# Patient Record
Sex: Female | Born: 1985
Health system: Southern US, Community
[De-identification: ages and names within clinical notes are randomized; demographics above are authoritative.]

## PROBLEM LIST (undated history)

## (undated) DIAGNOSIS — K589 Irritable bowel syndrome without diarrhea: Secondary | ICD-10-CM

## (undated) HISTORY — PX: NO PAST SURGERIES: SHX2092

## (undated) HISTORY — DX: Irritable bowel syndrome, unspecified: K58.9

---

## 2009-03-20 HISTORY — PX: AUGMENTATION MAMMAPLASTY: SUR837

## 2020-11-20 ENCOUNTER — Other Ambulatory Visit: Payer: Self-pay

## 2020-11-20 ENCOUNTER — Ambulatory Visit (HOSPITAL_COMMUNITY)
Admission: EM | Admit: 2020-11-20 | Discharge: 2020-11-20 | Disposition: A | Discharge status: Home / Self Care | Payer: 59

## 2020-11-20 ENCOUNTER — Encounter (HOSPITAL_COMMUNITY): Payer: Self-pay | Admitting: *Deleted

## 2020-11-20 ENCOUNTER — Ambulatory Visit (HOSPITAL_COMMUNITY): Admission: EM | Admit: 2020-11-20 | Payer: Self-pay

## 2020-11-20 DIAGNOSIS — R112 Nausea with vomiting, unspecified: Secondary | ICD-10-CM

## 2020-11-20 DIAGNOSIS — U071 COVID-19: Secondary | ICD-10-CM

## 2020-11-20 MED ORDER — FLUTICASONE PROPIONATE 50 MCG/ACT NA SUSP: 2.0000 | Freq: Every day | NASAL | 0 refills | Status: DC

## 2020-11-20 MED ORDER — ONDANSETRON HCL 4 MG PO TABS: 4.0000 mg | ORAL_TABLET | Freq: Four times a day (QID) | ORAL | 0 refills | Status: DC

## 2020-11-20 MED ORDER — BENZONATATE 100 MG PO CAPS: 100.0000 mg | ORAL_CAPSULE | Freq: Three times a day (TID) | ORAL | 0 refills | Status: DC

## 2020-11-20 NOTE — ED Triage Notes (Signed)
C/O sore throat, runny nose, HA, generalized "stomach ache", and vomiting onset yesterday.  Today had + home Covid test.  States able to keep down some PO fluids.

## 2020-11-20 NOTE — ED Provider Notes (Signed)
[facility]    CSN: [id number] Arrival date & time: 11/20/20  1506      History   Chief Complaint Chief Complaint  Patient presents with   Sore Throat   Headache    HPI Rhonda Aguirre is a 35 y.o. female.   HPI  COVID: Patient reports that yesterday she had a home positive COVID test.  She has symptoms such as sore throat, mild headache, generalized GI upset, runny nose. She has tried theraflu for symptoms with little relief. At the moment her nausea and vomiting are her worst symptom but this is better than it was yesterday. No hematemesis. No fevers, SOB, chest pain.   History reviewed. No pertinent past medical history.  There are no problems to display for this patient.   History reviewed. No pertinent surgical history.  OB History   No obstetric history on file.      Home Medications    Prior to Admission medications   Medication Sig Start Date End Date Taking? Authorizing Provider  UNKNOWN TO PATIENT MVI for skin   Yes [provider]    Family History Family History  Problem Relation Age of Onset   Rheum arthritis Mother     Social History Social History   Tobacco Use   Smoking status: Never   Smokeless tobacco: Never  Vaping Use   Vaping Use: Never used  Substance Use Topics   Alcohol use: Yes    Comment: 7 drinks/wk   Drug use: Never     Allergies   Patient has no known allergies.   Review of Systems Review of Systems  As stated above in HPI Physical Exam Triage Vital Signs ED Triage Vitals  Enc Vitals Group     BP 11/20/20 1540 111/68     Pulse Rate 11/20/20 1540 (!) 58     Resp 11/20/20 1540 16     Temp 11/20/20 1540 98.3 F (36.8 C)     Temp Source 11/20/20 1540 Oral     SpO2 11/20/20 1540 100 %     Weight --      Height --      Head Circumference --      Peak Flow --      Pain Score 11/20/20 1541 4     Pain Loc --      Pain Edu? --      Excl. in GC? --    No data found.  Updated Vital  Signs BP 111/68   Pulse (!) 58   Temp 98.3 F (36.8 C) (Oral)   Resp 16   LMP 11/17/2020 (Exact Date)   SpO2 100%   Physical Exam Vitals and nursing note reviewed.  Constitutional:      General: She is not in acute distress.    Appearance: She is well-developed. She is not ill-appearing, toxic-appearing or diaphoretic.  HENT:     Head: Normocephalic and atraumatic.     Right Ear: No tenderness. No middle ear effusion. Tympanic membrane is not erythematous.     Left Ear: Tympanic membrane normal. No tenderness.  No middle ear effusion. Tympanic membrane is not erythematous.     Nose: Congestion and rhinorrhea present.     Mouth/Throat:     Pharynx: Oropharynx is clear. Uvula midline. No oropharyngeal exudate, posterior oropharyngeal erythema or uvula swelling.     Tonsils: No tonsillar exudate or tonsillar abscesses.  Eyes:     Extraocular Movements:     Right eye: Normal extraocular  motion.     Left eye: Normal extraocular motion.     Conjunctiva/sclera: Conjunctivae normal.     Pupils: Pupils are equal, round, and reactive to light.  Cardiovascular:     Rate and Rhythm: Normal rate and regular rhythm.     Heart sounds: Normal heart sounds.  Pulmonary:     Effort: Pulmonary effort is normal.     Breath sounds: Normal breath sounds.  Musculoskeletal:     Cervical back: Normal range of motion and neck supple.  Lymphadenopathy:     Cervical: No cervical adenopathy.  Skin:    General: Skin is warm.  Neurological:     Mental Status: She is alert and oriented to person, place, and time.     UC Treatments / Results  Labs (all labs ordered are listed, but only abnormal results are displayed) Labs Reviewed - No data to display  EKG   Radiology No results found.  Procedures Procedures (including critical care time)  Medications Ordered in UC Medications - No data to display  Initial Impression / Assessment and Plan / UC Course  I have reviewed the triage vital  signs and the nursing notes.  Pertinent labs & imaging results that were available during my care of the patient were reviewed by me and considered in my medical decision making (see chart for details).     New.  Discussed COVID-19 including the importance of rest and hydration along with O2 monitoring.  We discussed red flag signs symptoms.  Discussed typical course of illness with the patient.  Sending in Zofran, Flonase and Tessalon for her to use as needed.  Final Clinical Impressions(s) / UC Diagnoses   Final diagnoses:  None   Discharge Instructions   None    ED Prescriptions   None    PDMP not reviewed this encounter.   Rushie Chestnut, [place] 11/20/20 1641

## 2021-04-29 ENCOUNTER — Other Ambulatory Visit: Payer: Self-pay

## 2021-04-29 ENCOUNTER — Ambulatory Visit
Admission: RE | Admit: 2021-04-29 | Discharge: 2021-04-29 | Disposition: A | Discharge status: Home / Self Care | Payer: 59 | Source: Ambulatory Visit | Attending: Family Medicine | Admitting: Family Medicine

## 2021-04-29 ENCOUNTER — Other Ambulatory Visit: Payer: Self-pay | Admitting: Family Medicine

## 2021-04-29 DIAGNOSIS — R1031 Right lower quadrant pain: Secondary | ICD-10-CM

## 2021-04-29 NOTE — Progress Notes (Signed)
4 wls of RLQ abd pain. Comes and goes w/ worsening after meals.

## 2021-06-21 ENCOUNTER — Encounter: Payer: Self-pay | Admitting: Gastroenterology

## 2021-06-30 ENCOUNTER — Encounter: Payer: 59 | Admitting: Internal Medicine

## 2021-07-12 ENCOUNTER — Ambulatory Visit: Payer: 59 | Admitting: Gastroenterology

## 2021-07-12 ENCOUNTER — Encounter: Payer: Self-pay | Admitting: Gastroenterology

## 2021-07-12 ENCOUNTER — Other Ambulatory Visit (INDEPENDENT_AMBULATORY_CARE_PROVIDER_SITE_OTHER): Payer: 59

## 2021-07-12 ENCOUNTER — Ambulatory Visit: Payer: 59 | Admitting: Internal Medicine

## 2021-07-12 VITALS — BP 104/70 | HR 57 | Ht 64.0 in | Wt 131.1 lb

## 2021-07-12 DIAGNOSIS — K581 Irritable bowel syndrome with constipation: Secondary | ICD-10-CM

## 2021-07-12 LAB — CBC WITH DIFFERENTIAL/PLATELET
Basophils Absolute: 0 10*3/uL (ref 0.0–0.1)
Basophils Relative: 0.5 % (ref 0.0–3.0)
Eosinophils Absolute: 0 10*3/uL (ref 0.0–0.7)
Eosinophils Relative: 0.1 % (ref 0.0–5.0)
HCT: 42.8 % (ref 36.0–46.0)
Hemoglobin: 14.7 g/dL (ref 12.0–15.0)
Lymphocytes Relative: 16.4 % (ref 12.0–46.0)
Lymphs Abs: 1.1 10*3/uL (ref 0.7–4.0)
MCHC: 34.3 g/dL (ref 30.0–36.0)
MCV: 98.5 fl (ref 78.0–100.0)
Monocytes Absolute: 0.4 10*3/uL (ref 0.1–1.0)
Monocytes Relative: 6.3 % (ref 3.0–12.0)
Neutro Abs: 5.1 10*3/uL (ref 1.4–7.7)
Neutrophils Relative %: 76.7 % (ref 43.0–77.0)
Platelets: 242 10*3/uL (ref 150.0–400.0)
RBC: 4.35 Mil/uL (ref 3.87–5.11)
RDW: 12.3 % (ref 11.5–15.5)
WBC: 6.6 10*3/uL (ref 4.0–10.5)

## 2021-07-12 LAB — COMPREHENSIVE METABOLIC PANEL
ALT: 41 U/L — ABNORMAL HIGH (ref 0–35)
AST: 37 U/L (ref 0–37)
Albumin: 5.1 g/dL (ref 3.5–5.2)
Alkaline Phosphatase: 44 U/L (ref 39–117)
BUN: 25 mg/dL — ABNORMAL HIGH (ref 6–23)
CO2: 23 mEq/L (ref 19–32)
Calcium: 10.1 mg/dL (ref 8.4–10.5)
Chloride: 102 mEq/L (ref 96–112)
Creatinine, Ser: 0.93 mg/dL (ref 0.40–1.20)
GFR: 78.08 mL/min (ref >60.00)
Glucose, Bld: 65 mg/dL — ABNORMAL LOW (ref 70–99)
Potassium: 4.1 mEq/L (ref 3.5–5.1)
Sodium: 135 mEq/L (ref 135–145)
Total Bilirubin: 0.5 mg/dL (ref 0.2–1.2)
Total Protein: 8.8 g/dL — ABNORMAL HIGH (ref 6.0–8.3)

## 2021-07-12 LAB — HIGH SENSITIVITY CRP: CRP, High Sensitivity: 0.38 mg/L (ref 0.000–5.000)

## 2021-07-12 NOTE — Progress Notes (Signed)
? ?HPI : Rhonda Aguirre is a very pleasant 36 year old female with no significant medical history who is referred to [place] by Dr. Shelly Flatten for further evaluation and treatment of chronic abdominal pain, constipation and bloating.  She states that she has had GI issues most of her life, but her symptoms have been particularly bothersome since around October of last year.   ?She reports a frequent abdominal pain, described as an 'ache' in her right lower quadrant, often worse following meals.  She says she is having regular bowel movements now, but that her stools are typically small in volume, 'dense' and unsatisfactory, and she always feels like she has a lot more stool to evacuate.   ?She used to take Metamucil on a daily basis, but stopped this past fall because she was travelling a lot and it was inconvenient to carry with her.    In addition, she wasn't certain it was helping much.  She used to take Miralax which was effective, but she didn't think it was good for her to take regularly.  Now she takes apple cider vinegar every other day.  She does not have diarrhea.  No blood in her stool.  Her weight has been stable.  No family history of GI malignancy, celiac disease or IBD. ?She reports a history of lactose intolerance and avoids dairy in general.  She also says that she follows a mostly gluten free diet for the past several years.  She does not follow a strict gluten free diet, mostly consuming gluten when out of the home and there are not other options, but on an average day she does not consume any gluten.  She does this she says she feels better in general without gluten. ?She underwent a 'colon cleanse' in January, but this did not help her symptoms, and then underwent a 'gut cleanse' which was a course of antibiotic (patient does not know which antibiotic specifically) for several days, which also did not help.  She tried taking a probiotic for a week. ?She has made an effort to increase her dietary  fiber intake ?   ? ? ?History reviewed. No pertinent past medical history. ? ? ?History reviewed. No pertinent surgical history. ?Family History  ?Problem Relation Age of Onset  ? Rheum arthritis Mother   ? Heart disease Father   ? Heart disease Paternal Grandfather   ? Colon cancer Neg Hx   ? Esophageal cancer Neg Hx   ? ?Social History  ? ?Tobacco Use  ? Smoking status: Never  ? Smokeless tobacco: Never  ?Vaping Use  ? Vaping Use: Never used  ?Substance Use Topics  ? Alcohol use: Yes  ?  Comment: 3 per week  ? Drug use: Never  ? ?Current Outpatient Medications  ?Medication Sig Dispense Refill  ? AMBULATORY NON FORMULARY MEDICATION 2 tablets daily in the afternoon. Medication Name: Multivitamin for Skin    ? clindamycin (CLEOCIN T) 1 % lotion Apply 1 application. topically every morning.    ? clindamycin-benzoyl peroxide (BENZACLIN) gel Apply 1 application. topically at bedtime.    ? Prenatal Vit-Fe Fumarate-FA (PRENATAL PO) Take 1 tablet by mouth daily in the afternoon.    ? ?No current facility-administered medications for this visit.  ? ?No Known Allergies ? ? ?Review of Systems: ?All systems reviewed and negative except where noted in HPI.  ? ? ?No results found. ? ?Physical Exam: ?BP 104/70   Pulse (!) 57   Ht 5\' 4"  (1.626 m)  Wt 131 lb 2 oz (59.5 kg)   BMI 22.51 kg/m?  ?Constitutional: Pleasant,well-developed, Caucasian female in no acute distress. ?HEENT: Normocephalic and atraumatic. Conjunctivae are normal. No scleral icterus. ?Neck supple.  ?Cardiovascular: Normal rate, regular rhythm.  ?Pulmonary/chest: Effort normal and breath sounds normal. No wheezing, rales or rhonchi. ?Abdominal: Soft, nondistended, mild tenderness to palpation in the RLQ without guarding or rigidity. Bowel sounds active throughout. There are no masses palpable. No hepatomegaly. ?Extremities: no edema ?Neurological: Alert and oriented to person place and time. ?Skin: Skin is warm and dry. No rashes noted. ?Psychiatric: Normal  mood and affect. Behavior is normal. ? ?CBC ?No results found for: WBC, RBC, HGB, HCT, PLT, MCV, MCH, MCHC, RDW, LYMPHSABS, MONOABS, EOSABS, BASOSABS ? ?CMP  ?No results found for: NA, K, CL, CO2, GLUCOSE, BUN, CREATININE, CALCIUM, PROT, ALBUMIN, AST, ALT, ALKPHOS, BILITOT, GFRNONAA, GFRAA ? ? ?ASSESSMENT AND PLAN: ?36 year old female with long standing chronic GI symptoms with several months of bothersome abdominal pain and unsatisfactory stools with sensation of incomplete evacuation.  Symptoms worsened around last fall, which is also when she stopped taking her daily fiber supplement.  Her history is consistent with IBS-C. ?We discussed the proposed pathophysiology of IBS and gut brain axis disorders in general.  We discussed management of IBS, to include use of medications to improve bowel habits, as needed pain medicine, centrally acting neuromodulators, role of empiric dietary modifications to include a low FODMAP diet, gluten-free diet, as well as the role of cognitive therapies.  We discussed the goals of IBS management, namely to minimize the impact of GI symptoms on quality of life.  The patient was provided handouts with further information on IBS.   ?The patient's primary complain was unsatisfactory stools, and I suggested that she resume her daily fiber supplement to improve her stool bulk and sensation of incomplete evacuation.  I suggested she start taking metamucil once a day, and if she is not noticing a change in her stool volume within 2 weeks, to increase to twice daily.  If no improvement, can consider other therapies such as Linzess vs. pursuing anorectal manometry ?Will get baseline CBC, CMP.  CRP to evaluate for possible Crohn's, H. Pylori and TTG/IgA. ? ?IBS-C ?- CBC, CMP, CRP, H. Pylori, TTG/IgA ?- Daily metamucil, increase to 2x/day if no improvement after 2 weeks ?- Consider manometry ? ?Vonzella Althaus E. Tomasa Rand, MD ?[facility] ? ? ?CC:  Ozella Rocks, MD ? ?

## 2021-07-12 NOTE — Patient Instructions (Signed)
If you are age 38 or older, your body mass index should be between 23-30. Your Body mass index is 22.51 kg/m?Marland Kitchen If this is out of the aforementioned range listed, please consider follow up with your Primary Care Provider. ? ?If you are age 56 or younger, your body mass index should be between 19-25. Your Body mass index is 22.51 kg/m?Marland Kitchen If this is out of the aformentioned range listed, please consider follow up with your Primary Care Provider.  ? ?________________________________________________________ ? ?The [place] [facility] providers would like to encourage you to use [facility] to communicate with providers for non-urgent requests or questions.  Due to long hold times on the telephone, sending your provider a message by [facility] may be a faster and more efficient way to get a response.  Please allow 48 business hours for a response.  Please remember that this is for non-urgent requests.  ?_______________________________________________________ ? ?Your provider has requested that you go to the basement level for lab work before leaving today. Press "B" on the elevator. The lab is located at the first door on the left as you exit the elevator. ? ? ?Continue Metamucil daily - increase to twice a day if no results after two weeks ? ?Please follow up on _______________________ ?

## 2021-07-13 ENCOUNTER — Encounter: Payer: Self-pay | Admitting: Gastroenterology

## 2021-07-13 LAB — TISSUE TRANSGLUTAMINASE, IGA: (tTG) Ab, IgA: <1 U/mL

## 2021-07-13 LAB — IGA: Immunoglobulin A: 233 mg/dL (ref 47–310)

## 2021-07-14 LAB — HELICOBACTER PYLORI  SPECIAL ANTIGEN
MICRO NUMBER:: 13315265
SPECIMEN QUALITY: ADEQUATE

## 2021-07-21 ENCOUNTER — Telehealth: Payer: Self-pay | Admitting: Gastroenterology

## 2021-07-21 NOTE — Telephone Encounter (Signed)
Patient is returning your call.  

## 2021-07-21 NOTE — Telephone Encounter (Signed)
Left message for pt to call back  °

## 2021-07-21 NOTE — Telephone Encounter (Signed)
Patient is calling requesting to speak with nurse regarding results. Please advise. ?

## 2021-07-21 NOTE — Progress Notes (Signed)
Rhonda Aguirre,  ?Please let Rhonda Aguirre know that her labs looked good.  Tests for H. Pylori and celiac disease were negative.  CRP was negative, making Crohn's disease unlikely.  CBC was normal.  CMP was notable for very mild liver enzyme elevation.  Transient ALT elevation is common and can be due to many things, including alcohol, physical exercise or medications.   Persistent ALT elevation may warrant further evaluation.  I recommend she used alcohol in moderation and repeat a CMP in 3-6 months. ?She can follow up with me in clinic as needed for further management of her IBS

## 2021-07-21 NOTE — Telephone Encounter (Signed)
Pt calling for lab results. Please advise. 

## 2021-07-22 NOTE — Telephone Encounter (Signed)
See result note.  

## 2021-08-30 ENCOUNTER — Ambulatory Visit: Payer: 59 | Admitting: Gastroenterology

## 2021-08-30 ENCOUNTER — Encounter: Payer: Self-pay | Admitting: Gastroenterology

## 2021-08-30 VITALS — BP 100/60 | HR 57 | Ht 64.0 in | Wt 131.0 lb

## 2021-08-30 DIAGNOSIS — K581 Irritable bowel syndrome with constipation: Secondary | ICD-10-CM

## 2021-08-30 NOTE — Progress Notes (Unsigned)
HPI : Rhonda Aguirre is a very pleasant 36 year old female who I initially saw April 25th with symptoms consistent with IBS-C.  I recommended she start taking Metamucil every day to improve her stool bulk and consistency, and to increase to twice a day if no improvement noted after 2 weeks.  Lab evaluation at that visit showed normal CBC, CMP and CRP.  Tests for H. pylori and celiac disease were negative. Today, the patient reports significant improvement since her last visit.  She has been taking Metamucil, and this is helped quite a bit with the sensation of incomplete evacuation and giving her more satisfactory stools.  The Metamucil did cause problems with bloating, but she has found that this is reduced if she takes the Metamucil in the evenings.  Currently, she takes 2 scoops of Metamucil in the evenings and this is worked pretty well.  She has also been following a strict gluten-free diet and is found that this has reduced her bad days. She has tried some relaxation techniques and prayer to help with her symptoms but has not found success yet. She does continue to have issues when she is traveling, she has found that it is harder to follow with a gluten-free diet and she gets out of her routine. She wonders if there are other fiber forms which may be easier to take and cause less bloating.   No past medical history on file.   No past surgical history on file. Family History  Problem Relation Age of Onset   Rheum arthritis Mother    Heart disease Father    Heart disease Paternal Grandfather    Colon cancer Neg Hx    Esophageal cancer Neg Hx    Social History   Tobacco Use   Smoking status: Never   Smokeless tobacco: Never  Vaping Use   Vaping Use: Never used  Substance Use Topics   Alcohol use: Yes    Comment: 3 per week   Drug use: Never   Current Outpatient Medications  Medication Sig Dispense Refill   AMBULATORY NON FORMULARY MEDICATION 2 tablets daily in the afternoon.  Medication Name: Multivitamin for Skin     clindamycin (CLEOCIN T) 1 % lotion Apply 1 application. topically every morning.     clindamycin-benzoyl peroxide (BENZACLIN) gel Apply 1 application. topically at bedtime.     Prenatal Vit-Fe Fumarate-FA (PRENATAL PO) Take 1 tablet by mouth daily in the afternoon.     No current facility-administered medications for this visit.   No Known Allergies   Review of Systems: All systems reviewed and negative except where noted in HPI.    No results found.  Physical Exam: BP 100/60   Pulse (!) 57   Ht 5\' 4"  (1.626 m)   Wt 131 lb (59.4 kg)   BMI 22.49 kg/m  Constitutional: Pleasant,well-developed, Caucasian female in no acute distress. HEENT: Normocephalic and atraumatic. Conjunctivae are normal. No scleral icterus. Neurological: Alert and oriented to person place and time. Skin: Skin is warm and dry. No rashes noted. Psychiatric: Normal mood and affect. Behavior is normal.  CBC    Component Value Date/Time   WBC 6.6 07/12/2021 0947   RBC 4.35 07/12/2021 0947   HGB 14.7 07/12/2021 0947   HCT 42.8 07/12/2021 0947   PLT 242.0 07/12/2021 0947   MCV 98.5 07/12/2021 0947   MCHC 34.3 07/12/2021 0947   RDW 12.3 07/12/2021 0947   LYMPHSABS 1.1 07/12/2021 0947   MONOABS 0.4 07/12/2021 0947   EOSABS  0.0 07/12/2021 0947   BASOSABS 0.0 07/12/2021 0947    CMP     Component Value Date/Time   NA 135 07/12/2021 0947   K 4.1 07/12/2021 0947   CL 102 07/12/2021 0947   CO2 23 07/12/2021 0947   GLUCOSE 65 (L) 07/12/2021 0947   BUN 25 (H) 07/12/2021 0947   CREATININE 0.93 07/12/2021 0947   CALCIUM 10.1 07/12/2021 0947   PROT 8.8 (H) 07/12/2021 0947   ALBUMIN 5.1 07/12/2021 0947   AST 37 07/12/2021 0947   ALT 41 (H) 07/12/2021 0947   ALKPHOS 44 07/12/2021 0947   BILITOT 0.5 07/12/2021 0947     ASSESSMENT AND PLAN: 36 year old female with IBS C, with clinical improvement following initiation of daily fiber supplementation.  Metamucil  worked very well to improve her stool bulk and reduce the sensation of incomplete evacuation.  It did not cause bothersome bloating, however she has found that if she takes the Metamucil at night deep breathing exercises limits bloating side effects.  She travels frequently for work, interested on other tips to help manage her symptoms. We discussed the importance of trying to stick to a routine is much as possible when traveling.  Although difficult, she should try to maintain the same diet as she does not use at home routinely.  She should continue to drink plenty of water. Suggest that she could try other dietary fiber supplements including the Metamucil Gummies, which are much more convenient to use when traveling.  Additionally, Citrucel may also work for her, and is typically associated with less bloating. I suggest that she also try incorporating kiwi fruit into her diet, which has been shown in studies to improve constipation symptoms. I suggested that she try taking senna as needed for when she goes 2 days without a bowel movement. We also discussed techniques such as deep breathing and diaphragmatic breathing can help promote relaxation and improve abdominal discomfort. She reports feeling better on a gluten-free diet, so I encouraged her to continue following a gluten-free diet, even though it is not medically necessary.  IBS-C - Continue Metamucil powder daily, can experiment with other forms of Metamucil as well as other fiber forms such as Citrucel fiber which formulation works best for her - Senna as needed - Continue gluten-free diet - Incorporate deep breathing exercises and diaphragmatic breathing exercises while traveling - Incorporate kiwi fruit is regular part of diet to improve constipation - Follow-up 2 to 3 months  Priseis Cratty E. Tomasa Rand, MD [facility]   No ref. provider found

## 2021-08-30 NOTE — Patient Instructions (Signed)
If you are age 38 or older, your body mass index should be between 23-30. Your Body mass index is 22.49 kg/m. If this is out of the aforementioned range listed, please consider follow up with your Primary Care Provider.  If you are age 73 or younger, your body mass index should be between 19-25. Your Body mass index is 22.49 kg/m. If this is out of the aformentioned range listed, please consider follow up with your Primary Care Provider.   Try Kiwi to help with constipation.  Continue Metamucil- gummies and try Citrucel.  Take Senna as needed when traveling.    The [place] [facility] providers would like to encourage you to use [facility] to communicate with providers for non-urgent requests or questions.  Due to long hold times on the telephone, sending your provider a message by [facility] may be a faster and more efficient way to get a response.  Please allow 48 business hours for a response.  Please remember that this is for non-urgent requests.   It was a pleasure to see you today!  Thank you for trusting me with your gastrointestinal care!    Scott E.Tomasa Rand, MD

## 2021-09-29 ENCOUNTER — Telehealth: Payer: Self-pay | Admitting: Gastroenterology

## 2021-09-29 NOTE — Telephone Encounter (Signed)
Pt states her double dose of miralax is not working. States she has been taking the senna and nothing is working. Discussed with pt that she could try a miralax purge using 7 doses of miralax with 32oz of gatorade and drink 1 cup every until gone. Pt verbalized understanding.

## 2021-09-29 NOTE — Telephone Encounter (Signed)
Left message for pt to call back  °

## 2021-09-29 NOTE — Telephone Encounter (Signed)
Patient returned your call, please advise. 

## 2021-09-29 NOTE — Telephone Encounter (Signed)
Rhonda Aguirre called this morning stating she cannot go to the bathroom.  She has celiac and IBS and has been following all the protocols, but nothing is working for her.  She requested an appointment with Dr. Tomasa Rand some time this week or soon, but there is nothing until 8/15.  Please call Rhonda Aguirre and advise.  Thank you.

## 2021-10-21 ENCOUNTER — Other Ambulatory Visit: Payer: Self-pay

## 2021-10-21 DIAGNOSIS — R7989 Other specified abnormal findings of blood chemistry: Secondary | ICD-10-CM

## 2021-10-31 ENCOUNTER — Other Ambulatory Visit (INDEPENDENT_AMBULATORY_CARE_PROVIDER_SITE_OTHER): Payer: 59

## 2021-10-31 DIAGNOSIS — R7989 Other specified abnormal findings of blood chemistry: Secondary | ICD-10-CM | POA: Diagnosis not present

## 2021-10-31 LAB — COMPREHENSIVE METABOLIC PANEL
ALT: 28 U/L (ref 0–35)
AST: 36 U/L (ref 0–37)
Albumin: 4.6 g/dL (ref 3.5–5.2)
Alkaline Phosphatase: 38 U/L — ABNORMAL LOW (ref 39–117)
BUN: 18 mg/dL (ref 6–23)
CO2: 22 mEq/L (ref 19–32)
Calcium: 9.6 mg/dL (ref 8.4–10.5)
Chloride: 104 mEq/L (ref 96–112)
Creatinine, Ser: 0.97 mg/dL (ref 0.40–1.20)
GFR: 74.07 mL/min (ref >60.00)
Glucose, Bld: 69 mg/dL — ABNORMAL LOW (ref 70–99)
Potassium: 3.9 mEq/L (ref 3.5–5.1)
Sodium: 136 mEq/L (ref 135–145)
Total Bilirubin: 0.4 mg/dL (ref 0.2–1.2)
Total Protein: 8.1 g/dL (ref 6.0–8.3)

## 2021-11-01 ENCOUNTER — Encounter: Payer: Self-pay | Admitting: Gastroenterology

## 2021-11-01 ENCOUNTER — Ambulatory Visit: Payer: 59 | Admitting: Gastroenterology

## 2021-11-01 VITALS — BP 108/80 | HR 56 | Ht 64.0 in | Wt 130.0 lb

## 2021-11-01 DIAGNOSIS — K581 Irritable bowel syndrome with constipation: Secondary | ICD-10-CM | POA: Diagnosis not present

## 2021-11-01 NOTE — Patient Instructions (Signed)
_______________________________________________________  If you are age 38 or older, your body mass index should be between 23-30. Your Body mass index is 22.31 kg/m. If this is out of the aforementioned range listed, please consider follow up with your Primary Care Provider.  If you are age 67 or younger, your body mass index should be between 19-25. Your Body mass index is 22.31 kg/m. If this is out of the aformentioned range listed, please consider follow up with your Primary Care Provider.   Continue current Regimen.  Follow up as needed.   The [place] [facility] providers would like to encourage you to use [facility] to communicate with providers for non-urgent requests or questions.  Due to long hold times on the telephone, sending your provider a message by [facility] may be a faster and more efficient way to get a response.  Please allow 48 business hours for a response.  Please remember that this is for non-urgent requests.   It was a pleasure to see you today!  Thank you for trusting me with your gastrointestinal care!    Scott E. Tomasa Rand, MD

## 2021-11-01 NOTE — Progress Notes (Signed)
Normal liver enzymes/CMP.  Discussed with patient at her clinic visit today

## 2021-11-01 NOTE — Progress Notes (Signed)
HPI : Rhonda Aguirre is a very pleasant 36 year old female with IBS-C who I initially saw April 25.  She showed clinical improvement with initiation of regular Metamucil and as needed senna as well as a gluten-free diet.   Her symptoms were doing much better when I saw her in June and I recommended she start incorporating kiwi fruit into her diet and to consider other forms of fiber supplements such as Citrucel which may cause less bloating. She continues to do well for the next month, but had an episode in July where she went 6 days without a bowel movement.  This was in the setting of being treated with prednisone for an allergic reaction.  She contacted our office and our nurse recommended trying a MiraLAX purge.  She reports that this was successful, and her symptoms have been doing well since then. Overall, her symptoms are much better, but she does still have worsening of her symptoms when she travels, either for work or on vacation.  She was recommended to take senna as needed, but she had concerns about the safety of taking senna long-term.  She has taken it a couple times in the past, but has not had fantastic results from it. She takes 2 scoops of Metamucil at night, he is [place] fruit on a near daily basis as well as prunes.  She continues to follow a strict gluten-free diet.  Past Medical History:  Diagnosis Date   IBS (irritable bowel syndrome)    with constipation     Past Surgical History:  Procedure Laterality Date   NO PAST SURGERIES     Family History  Problem Relation Age of Onset   Rheum arthritis Mother    Heart disease Father    Heart disease Paternal Grandfather    Colon cancer Neg Hx    Esophageal cancer Neg Hx    Social History   Tobacco Use   Smoking status: Never   Smokeless tobacco: Never  Vaping Use   Vaping Use: Never used  Substance Use Topics   Alcohol use: Yes    Comment: 3 per week   Drug use: Never   Current Outpatient Medications   Medication Sig Dispense Refill   AMBULATORY NON FORMULARY MEDICATION 2 tablets daily in the afternoon. Medication Name: Multivitamin for Skin     clindamycin (CLEOCIN T) 1 % lotion Apply 1 application. topically every morning.     clindamycin-benzoyl peroxide (BENZACLIN) gel Apply 1 application. topically at bedtime.     Prenatal Vit-Fe Fumarate-FA (PRENATAL PO) Take 1 tablet by mouth daily in the afternoon.     psyllium (METAMUCIL) 58.6 % powder Take 1 packet by mouth daily.     No current facility-administered medications for this visit.   No Known Allergies   Review of Systems: All systems reviewed and negative except where noted in HPI.    No results found.  Physical Exam: BP 108/80 (BP Location: Left Arm, Patient Position: Sitting, Cuff Size: Normal)   Pulse (!) 56   Ht 5\' 4"  (1.626 m)   Wt 130 lb (59 kg)   LMP 10/31/2021 (Exact Date)   BMI 22.31 kg/m  Constitutional: Pleasant,well-developed, Caucasian female in no acute distress. HEENT: Normocephalic and atraumatic. Conjunctivae are normal. No scleral icterus. Extremities: no edema Neurological: Alert and oriented to person place and time. Skin: Skin is warm and dry. No rashes noted. Psychiatric: Normal mood and affect. Behavior is normal.  CBC    Component Value Date/Time   WBC  6.6 07/12/2021 0947   RBC 4.35 07/12/2021 0947   HGB 14.7 07/12/2021 0947   HCT 42.8 07/12/2021 0947   PLT 242.0 07/12/2021 0947   MCV 98.5 07/12/2021 0947   MCHC 34.3 07/12/2021 0947   RDW 12.3 07/12/2021 0947   LYMPHSABS 1.1 07/12/2021 0947   MONOABS 0.4 07/12/2021 0947   EOSABS 0.0 07/12/2021 0947   BASOSABS 0.0 07/12/2021 0947    CMP     Component Value Date/Time   NA 136 10/31/2021 0739   K 3.9 10/31/2021 0739   CL 104 10/31/2021 0739   CO2 22 10/31/2021 0739   GLUCOSE 69 (L) 10/31/2021 0739   BUN 18 10/31/2021 0739   CREATININE 0.97 10/31/2021 0739   CALCIUM 9.6 10/31/2021 0739   PROT 8.1 10/31/2021 0739   ALBUMIN  4.6 10/31/2021 0739   AST 36 10/31/2021 0739   ALT 28 10/31/2021 0739   ALKPHOS 38 (L) 10/31/2021 0739   BILITOT 0.4 10/31/2021 0739     ASSESSMENT AND PLAN: 36 year old female with IBS-C, with good clinical response to daily Metamucil.  She is also experienced improvement with incorporating kiwi fruit and prunes into her diet.  She follows a strict gluten-free diet, as her symptoms are much better with this diet, although she does not have celiac disease.  She was concerned about using senna frequently, and I reassured her that senna is very safe and effective with long-term use.  I did recommend she only use it when needed, not on a daily basis.   IBS-C - Continue Metamucil daily - Continue incorporation of kiwi fruit and prunes in diet - Use Senna PRN when no BM in 2 days - Continue gluten-free diet - Follow-up as needed  Yoneko Talerico E. Tomasa Rand, MD  Gastroenterology  No ref. provider found

## 2021-11-09 ENCOUNTER — Ambulatory Visit: Payer: 59 | Admitting: Gastroenterology

## 2021-12-20 ENCOUNTER — Ambulatory Visit: Payer: 59 | Admitting: Internal Medicine

## 2022-01-04 ENCOUNTER — Telehealth: Payer: Self-pay | Admitting: Gastroenterology

## 2022-01-04 NOTE — Telephone Encounter (Signed)
Left message for pt to call back.  Pt states she has been bloated for the past 5 weeks. She did a cleanse and is not constipated and has been following a gluten free diet. Discussed with pt that she needs to avoid foods that produce a lot of gas such as broccoli/cabbage etc. Recommended pt try IB Guard that is available over the counter. Pt verbalized understanding.

## 2022-01-04 NOTE — Telephone Encounter (Signed)
Inbound call from patient requesting suggestions on her ibs, states she has had bloating for 5 weeks and she has tried everything she can think of to help. Please advise.

## 2022-09-09 IMAGING — CR DG ABDOMEN 2V
2 series · 2 of 2 positions shown · non-contrast
Comparison: None.

CLINICAL DATA: Right lower quadrant abdominal pain

EXAM:
ABDOMEN - 2 VIEW

[w abdomen upright (1 of 2)]
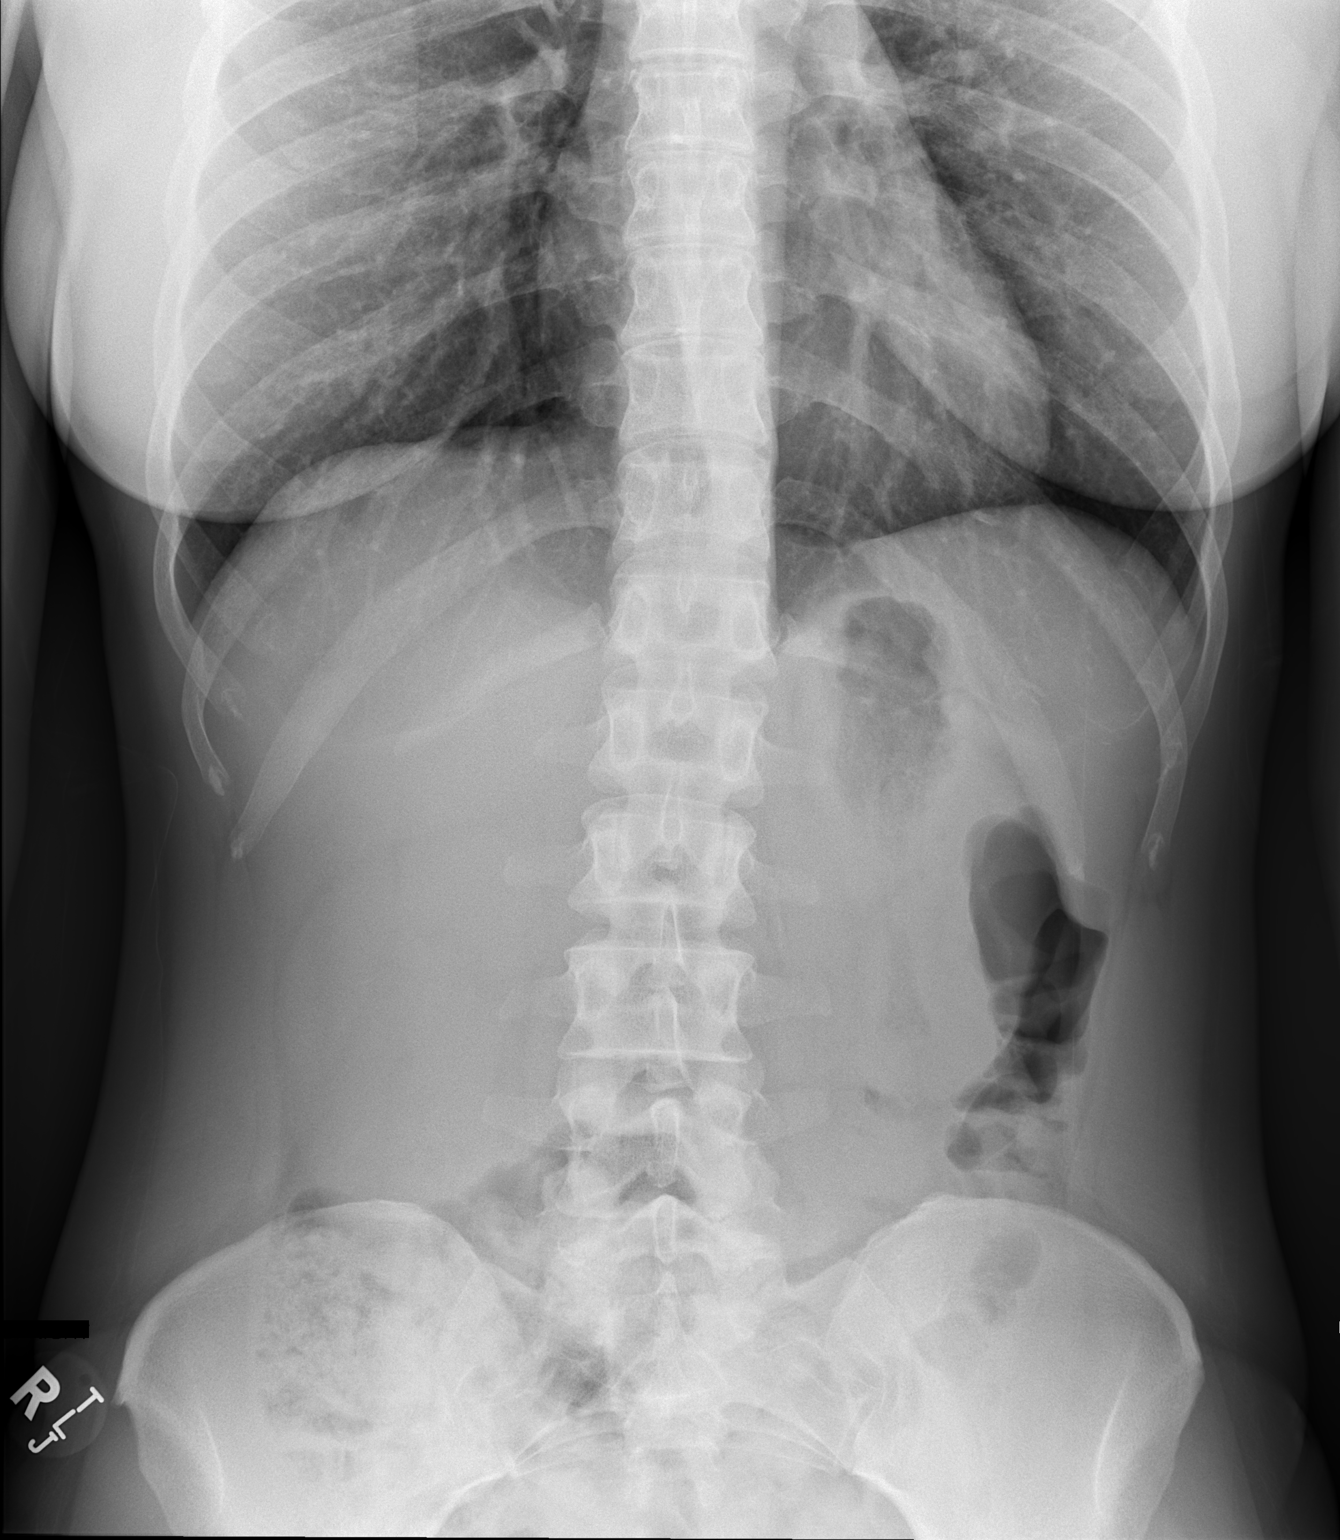

[w abdomen upright (2 of 2)]
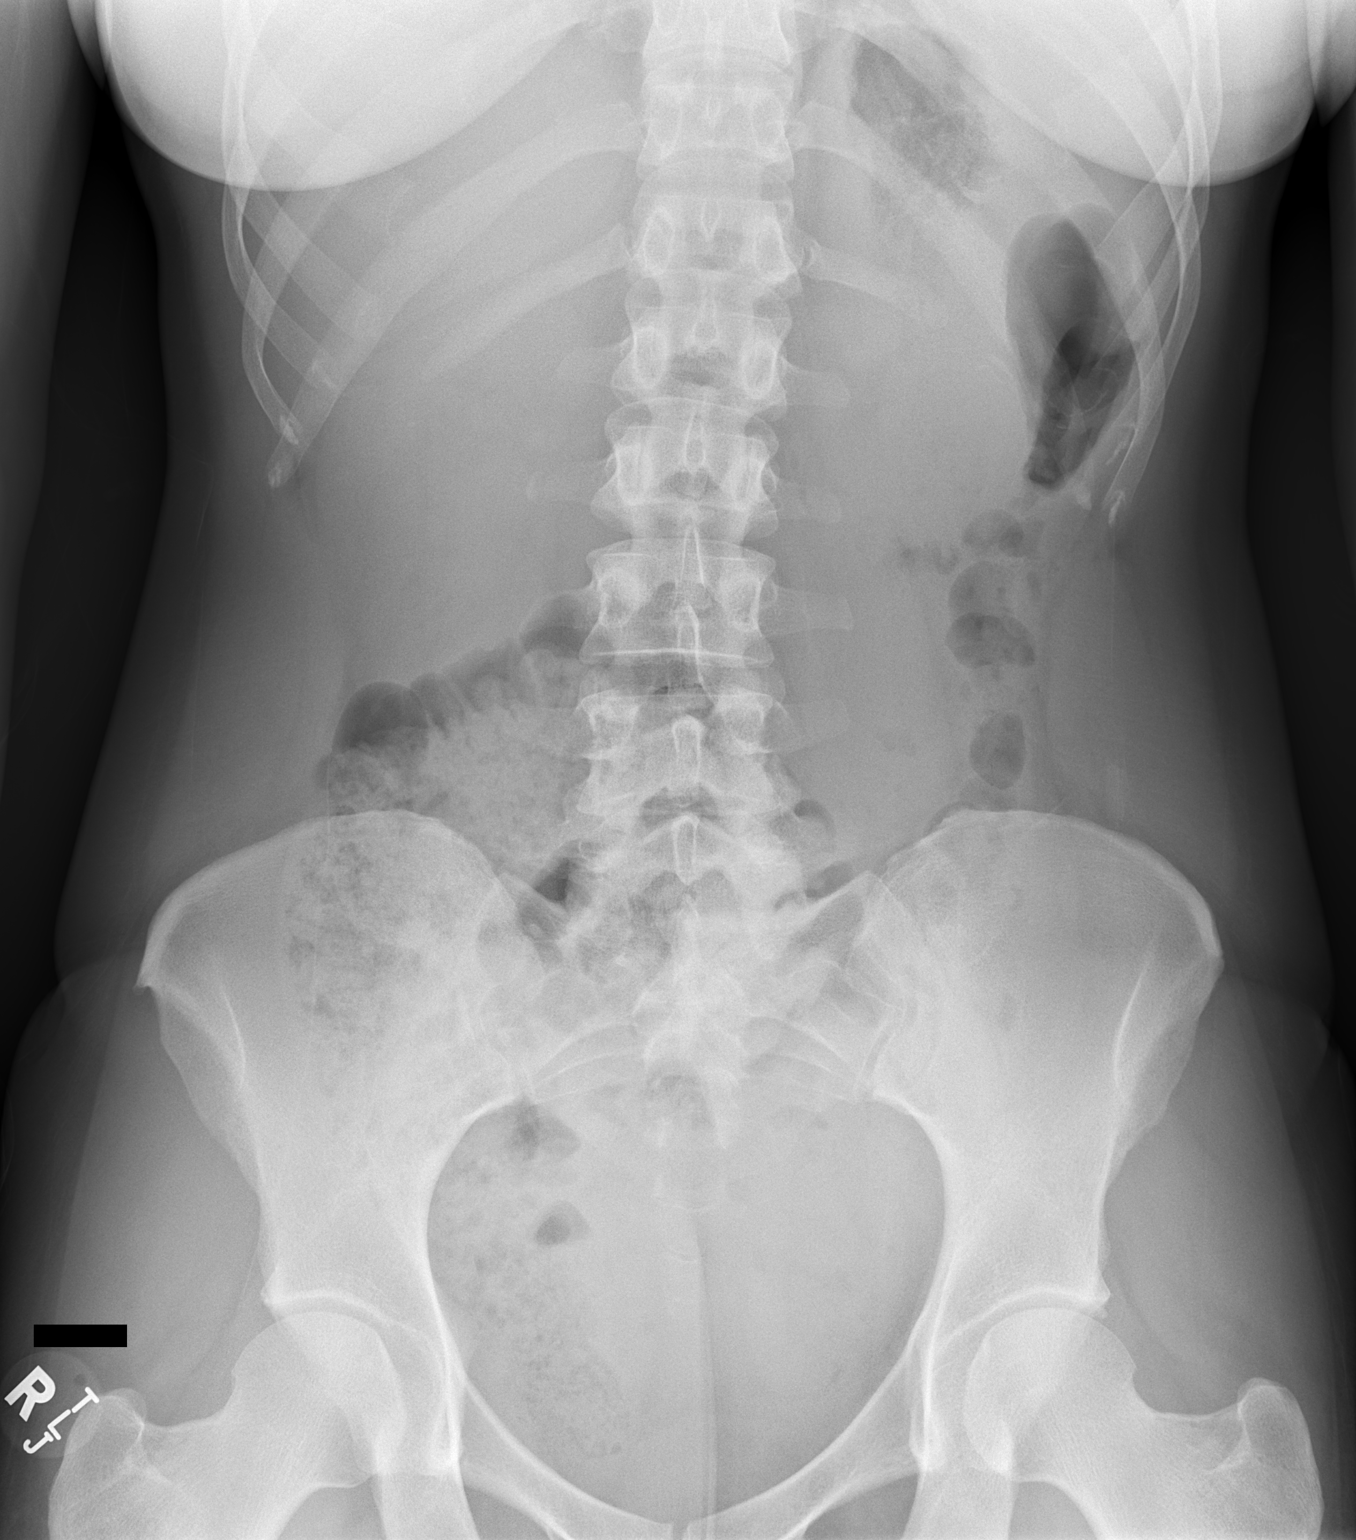

[2 of 2 positions shown; findings below may reference images not displayed]

FINDINGS: The bowel gas pattern is normal. There is no evidence of free air.
Moderate volume of stool within the right colon. No radio-opaque
calculi or other significant radiographic abnormality is seen.
Osseous structures within normal limits.
IMPRESSION: Nonobstructive bowel gas pattern. Moderate volume of stool within
the right colon.

## 2023-03-22 ENCOUNTER — Other Ambulatory Visit: Payer: Self-pay | Admitting: Dermatology

## 2023-03-22 DIAGNOSIS — E069 Thyroiditis, unspecified: Secondary | ICD-10-CM

## 2023-03-23 ENCOUNTER — Ambulatory Visit
Admission: RE | Admit: 2023-03-23 | Discharge: 2023-03-23 | Disposition: A | Discharge status: Home / Self Care | Payer: 59 | Source: Ambulatory Visit | Attending: Dermatology | Admitting: Dermatology

## 2023-03-23 DIAGNOSIS — E069 Thyroiditis, unspecified: Secondary | ICD-10-CM

## 2023-08-28 ENCOUNTER — Other Ambulatory Visit: Payer: Self-pay | Admitting: Family Medicine

## 2023-08-28 DIAGNOSIS — Z139 Encounter for screening, unspecified: Secondary | ICD-10-CM

## 2023-09-05 ENCOUNTER — Ambulatory Visit

## 2023-09-11 ENCOUNTER — Ambulatory Visit
Admission: RE | Admit: 2023-09-11 | Discharge: 2023-09-11 | Disposition: A | Discharge status: Home / Self Care | Source: Ambulatory Visit | Attending: Family Medicine | Admitting: Family Medicine

## 2023-09-11 ENCOUNTER — Other Ambulatory Visit: Payer: Self-pay | Admitting: Family Medicine

## 2023-09-11 DIAGNOSIS — Z139 Encounter for screening, unspecified: Secondary | ICD-10-CM

## 2023-09-14 ENCOUNTER — Other Ambulatory Visit: Payer: Self-pay | Admitting: Family Medicine

## 2023-09-14 DIAGNOSIS — R928 Other abnormal and inconclusive findings on diagnostic imaging of breast: Secondary | ICD-10-CM

## 2023-09-25 ENCOUNTER — Ambulatory Visit
Admission: RE | Admit: 2023-09-25 | Discharge: 2023-09-25 | Disposition: A | Discharge status: Home / Self Care | Source: Ambulatory Visit | Attending: Family Medicine | Admitting: Family Medicine

## 2023-09-25 DIAGNOSIS — R928 Other abnormal and inconclusive findings on diagnostic imaging of breast: Secondary | ICD-10-CM
